# Patient Record
Sex: Female | Born: 2000 | Hispanic: No | Marital: Single | Smoking: Never smoker
Health system: Southern US, Community
[De-identification: ages and names within clinical notes are randomized; demographics above are authoritative.]

## PROBLEM LIST (undated history)

## (undated) ENCOUNTER — Ambulatory Visit: Admission: EM | Discharge status: Absent without leave | Payer: Medicaid Other

---
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
## 2019-04-02 ENCOUNTER — Emergency Department (HOSPITAL_COMMUNITY)
Admission: EM | Admit: 2019-04-02 | Discharge: 2019-04-02 | Discharge status: Against Medical Advice | Payer: Medicaid Other

## 2019-04-02 NOTE — ED Notes (Signed)
Pt states that she is leaving even though she is going to a fast tract room and does not wish to be seen

## 2019-04-03 ENCOUNTER — Other Ambulatory Visit: Payer: Self-pay

## 2019-04-03 DIAGNOSIS — Z20822 Contact with and (suspected) exposure to covid-19: Secondary | ICD-10-CM

## 2019-04-04 LAB — NOVEL CORONAVIRUS, NAA: SARS-CoV-2, NAA: NOT DETECTED

## 2019-04-12 ENCOUNTER — Other Ambulatory Visit: Payer: Self-pay

## 2019-04-12 DIAGNOSIS — Z20822 Contact with and (suspected) exposure to covid-19: Secondary | ICD-10-CM

## 2019-04-13 LAB — NOVEL CORONAVIRUS, NAA: SARS-CoV-2, NAA: NOT DETECTED

## 2019-06-22 ENCOUNTER — Other Ambulatory Visit: Payer: Self-pay

## 2019-06-22 DIAGNOSIS — Z20822 Contact with and (suspected) exposure to covid-19: Secondary | ICD-10-CM

## 2019-06-25 LAB — NOVEL CORONAVIRUS, NAA: SARS-CoV-2, NAA: DETECTED — AB

## 2019-07-06 ENCOUNTER — Other Ambulatory Visit: Payer: Self-pay

## 2019-07-06 DIAGNOSIS — Z20822 Contact with and (suspected) exposure to covid-19: Secondary | ICD-10-CM

## 2019-07-09 LAB — NOVEL CORONAVIRUS, NAA: SARS-CoV-2, NAA: NOT DETECTED

## 2019-07-17 ENCOUNTER — Other Ambulatory Visit: Payer: Self-pay

## 2019-07-17 DIAGNOSIS — Z20822 Contact with and (suspected) exposure to covid-19: Secondary | ICD-10-CM

## 2019-07-18 LAB — NOVEL CORONAVIRUS, NAA: SARS-CoV-2, NAA: NOT DETECTED

## 2020-02-21 DIAGNOSIS — Z419 Encounter for procedure for purposes other than remedying health state, unspecified: Secondary | ICD-10-CM | POA: Diagnosis not present

## 2020-03-23 DIAGNOSIS — Z419 Encounter for procedure for purposes other than remedying health state, unspecified: Secondary | ICD-10-CM | POA: Diagnosis not present

## 2020-04-23 DIAGNOSIS — Z419 Encounter for procedure for purposes other than remedying health state, unspecified: Secondary | ICD-10-CM | POA: Diagnosis not present

## 2020-05-23 DIAGNOSIS — Z419 Encounter for procedure for purposes other than remedying health state, unspecified: Secondary | ICD-10-CM | POA: Diagnosis not present

## 2020-06-23 DIAGNOSIS — Z419 Encounter for procedure for purposes other than remedying health state, unspecified: Secondary | ICD-10-CM | POA: Diagnosis not present

## 2020-07-23 DIAGNOSIS — Z419 Encounter for procedure for purposes other than remedying health state, unspecified: Secondary | ICD-10-CM | POA: Diagnosis not present

## 2020-08-23 DIAGNOSIS — Z419 Encounter for procedure for purposes other than remedying health state, unspecified: Secondary | ICD-10-CM | POA: Diagnosis not present

## 2020-09-23 DIAGNOSIS — Z419 Encounter for procedure for purposes other than remedying health state, unspecified: Secondary | ICD-10-CM | POA: Diagnosis not present

## 2020-10-21 DIAGNOSIS — Z419 Encounter for procedure for purposes other than remedying health state, unspecified: Secondary | ICD-10-CM | POA: Diagnosis not present

## 2020-11-21 DIAGNOSIS — Z419 Encounter for procedure for purposes other than remedying health state, unspecified: Secondary | ICD-10-CM | POA: Diagnosis not present

## 2020-12-21 DIAGNOSIS — Z419 Encounter for procedure for purposes other than remedying health state, unspecified: Secondary | ICD-10-CM | POA: Diagnosis not present

## 2021-01-21 DIAGNOSIS — Z419 Encounter for procedure for purposes other than remedying health state, unspecified: Secondary | ICD-10-CM | POA: Diagnosis not present

## 2021-02-20 DIAGNOSIS — Z419 Encounter for procedure for purposes other than remedying health state, unspecified: Secondary | ICD-10-CM | POA: Diagnosis not present

## 2021-03-23 DIAGNOSIS — Z419 Encounter for procedure for purposes other than remedying health state, unspecified: Secondary | ICD-10-CM | POA: Diagnosis not present

## 2021-04-23 DIAGNOSIS — Z419 Encounter for procedure for purposes other than remedying health state, unspecified: Secondary | ICD-10-CM | POA: Diagnosis not present

## 2021-05-23 DIAGNOSIS — Z419 Encounter for procedure for purposes other than remedying health state, unspecified: Secondary | ICD-10-CM | POA: Diagnosis not present

## 2021-06-23 DIAGNOSIS — Z419 Encounter for procedure for purposes other than remedying health state, unspecified: Secondary | ICD-10-CM | POA: Diagnosis not present

## 2021-07-23 DIAGNOSIS — Z419 Encounter for procedure for purposes other than remedying health state, unspecified: Secondary | ICD-10-CM | POA: Diagnosis not present

## 2021-08-23 DIAGNOSIS — Z419 Encounter for procedure for purposes other than remedying health state, unspecified: Secondary | ICD-10-CM | POA: Diagnosis not present

## 2021-09-23 DIAGNOSIS — Z419 Encounter for procedure for purposes other than remedying health state, unspecified: Secondary | ICD-10-CM | POA: Diagnosis not present

## 2021-10-21 DIAGNOSIS — Z419 Encounter for procedure for purposes other than remedying health state, unspecified: Secondary | ICD-10-CM | POA: Diagnosis not present

## 2021-11-21 DIAGNOSIS — Z419 Encounter for procedure for purposes other than remedying health state, unspecified: Secondary | ICD-10-CM | POA: Diagnosis not present

## 2021-12-21 DIAGNOSIS — Z419 Encounter for procedure for purposes other than remedying health state, unspecified: Secondary | ICD-10-CM | POA: Diagnosis not present

## 2022-01-21 DIAGNOSIS — Z419 Encounter for procedure for purposes other than remedying health state, unspecified: Secondary | ICD-10-CM | POA: Diagnosis not present

## 2022-02-20 DIAGNOSIS — Z419 Encounter for procedure for purposes other than remedying health state, unspecified: Secondary | ICD-10-CM | POA: Diagnosis not present

## 2022-03-23 DIAGNOSIS — Z419 Encounter for procedure for purposes other than remedying health state, unspecified: Secondary | ICD-10-CM | POA: Diagnosis not present

## 2022-04-23 DIAGNOSIS — Z419 Encounter for procedure for purposes other than remedying health state, unspecified: Secondary | ICD-10-CM | POA: Diagnosis not present

## 2022-05-23 DIAGNOSIS — Z419 Encounter for procedure for purposes other than remedying health state, unspecified: Secondary | ICD-10-CM | POA: Diagnosis not present

## 2022-06-23 DIAGNOSIS — Z419 Encounter for procedure for purposes other than remedying health state, unspecified: Secondary | ICD-10-CM | POA: Diagnosis not present

## 2022-07-23 DIAGNOSIS — Z419 Encounter for procedure for purposes other than remedying health state, unspecified: Secondary | ICD-10-CM | POA: Diagnosis not present

## 2022-08-23 DIAGNOSIS — Z419 Encounter for procedure for purposes other than remedying health state, unspecified: Secondary | ICD-10-CM | POA: Diagnosis not present

## 2022-09-23 DIAGNOSIS — Z419 Encounter for procedure for purposes other than remedying health state, unspecified: Secondary | ICD-10-CM | POA: Diagnosis not present

## 2022-10-22 DIAGNOSIS — Z419 Encounter for procedure for purposes other than remedying health state, unspecified: Secondary | ICD-10-CM | POA: Diagnosis not present

## 2022-11-22 DIAGNOSIS — Z419 Encounter for procedure for purposes other than remedying health state, unspecified: Secondary | ICD-10-CM | POA: Diagnosis not present

## 2022-12-22 DIAGNOSIS — Z419 Encounter for procedure for purposes other than remedying health state, unspecified: Secondary | ICD-10-CM | POA: Diagnosis not present

## 2023-01-22 DIAGNOSIS — Z419 Encounter for procedure for purposes other than remedying health state, unspecified: Secondary | ICD-10-CM | POA: Diagnosis not present

## 2023-02-21 DIAGNOSIS — Z419 Encounter for procedure for purposes other than remedying health state, unspecified: Secondary | ICD-10-CM | POA: Diagnosis not present

## 2023-03-24 DIAGNOSIS — Z419 Encounter for procedure for purposes other than remedying health state, unspecified: Secondary | ICD-10-CM | POA: Diagnosis not present

## 2023-04-24 DIAGNOSIS — Z419 Encounter for procedure for purposes other than remedying health state, unspecified: Secondary | ICD-10-CM | POA: Diagnosis not present

## 2023-05-24 DIAGNOSIS — Z419 Encounter for procedure for purposes other than remedying health state, unspecified: Secondary | ICD-10-CM | POA: Diagnosis not present

## 2023-06-24 DIAGNOSIS — Z419 Encounter for procedure for purposes other than remedying health state, unspecified: Secondary | ICD-10-CM | POA: Diagnosis not present

## 2023-07-24 DIAGNOSIS — Z419 Encounter for procedure for purposes other than remedying health state, unspecified: Secondary | ICD-10-CM | POA: Diagnosis not present

## 2023-08-24 DIAGNOSIS — Z419 Encounter for procedure for purposes other than remedying health state, unspecified: Secondary | ICD-10-CM | POA: Diagnosis not present

## 2023-09-24 DIAGNOSIS — Z419 Encounter for procedure for purposes other than remedying health state, unspecified: Secondary | ICD-10-CM | POA: Diagnosis not present

## 2023-10-22 DIAGNOSIS — Z419 Encounter for procedure for purposes other than remedying health state, unspecified: Secondary | ICD-10-CM | POA: Diagnosis not present

## 2023-12-03 DIAGNOSIS — Z419 Encounter for procedure for purposes other than remedying health state, unspecified: Secondary | ICD-10-CM | POA: Diagnosis not present

## 2024-01-02 DIAGNOSIS — Z419 Encounter for procedure for purposes other than remedying health state, unspecified: Secondary | ICD-10-CM | POA: Diagnosis not present

## 2024-02-02 DIAGNOSIS — Z419 Encounter for procedure for purposes other than remedying health state, unspecified: Secondary | ICD-10-CM | POA: Diagnosis not present

## 2024-03-03 DIAGNOSIS — Z419 Encounter for procedure for purposes other than remedying health state, unspecified: Secondary | ICD-10-CM | POA: Diagnosis not present

## 2024-04-03 DIAGNOSIS — Z419 Encounter for procedure for purposes other than remedying health state, unspecified: Secondary | ICD-10-CM | POA: Diagnosis not present

## 2024-05-04 DIAGNOSIS — Z419 Encounter for procedure for purposes other than remedying health state, unspecified: Secondary | ICD-10-CM | POA: Diagnosis not present

## 2024-07-04 DIAGNOSIS — Z419 Encounter for procedure for purposes other than remedying health state, unspecified: Secondary | ICD-10-CM | POA: Diagnosis not present
